# Patient Record
Sex: Female | Born: 1985 | Hispanic: Yes | Marital: Single | Smoking: Never smoker
Health system: Southern US, Community
[De-identification: ages and names within clinical notes are randomized; demographics above are authoritative.]

---

## 2007-05-16 ENCOUNTER — Emergency Department: Payer: Self-pay | Admitting: Emergency Medicine

## 2011-11-14 ENCOUNTER — Emergency Department: Payer: Self-pay | Admitting: Emergency Medicine

## 2012-01-12 ENCOUNTER — Emergency Department: Payer: Self-pay | Admitting: Emergency Medicine

## 2012-01-12 LAB — COMPREHENSIVE METABOLIC PANEL
Albumin: 3.7 g/dL (ref 3.4–5.0)
Alkaline Phosphatase: 75 U/L (ref 50–136)
BUN: 8 mg/dL (ref 7–18)
Chloride: 104 mmol/L (ref 98–107)
Co2: 25 mmol/L (ref 21–32)
Creatinine: 0.66 mg/dL (ref 0.60–1.30)
EGFR (African American): >60
Glucose: 89 mg/dL (ref 65–99)
SGOT(AST): 16 U/L (ref 15–37)
SGPT (ALT): 27 U/L
Sodium: 138 mmol/L (ref 136–145)
Total Protein: 7.3 g/dL (ref 6.4–8.2)

## 2012-01-12 LAB — URINALYSIS, COMPLETE
Bilirubin,UR: NEGATIVE
Glucose,UR: NEGATIVE mg/dL (ref 0–75)
Nitrite: NEGATIVE
Ph: 5 (ref 4.5–8.0)
Protein: NEGATIVE
RBC,UR: 1 /HPF (ref 0–5)
Specific Gravity: 1.023 (ref 1.003–1.030)
WBC UR: 2 /HPF (ref 0–5)

## 2012-01-12 LAB — CBC
HCT: 39.2 % (ref 35.0–47.0)
HGB: 13.3 g/dL (ref 12.0–16.0)
MCH: 30.3 pg (ref 26.0–34.0)
MCHC: 33.9 g/dL (ref 32.0–36.0)
MCV: 89 fL (ref 80–100)
RDW: 12.5 % (ref 11.5–14.5)
WBC: 14.9 10*3/uL — ABNORMAL HIGH (ref 3.6–11.0)

## 2012-01-12 LAB — WET PREP, GENITAL

## 2013-05-21 IMAGING — CT CT ABD-PELV W/ CM
1 of 2 series · 15 of 32 positions shown, 19 images · non-contrast
Comparison: none

REASON FOR EXAM: (1) rlq pain; (2) rlq pain
COMMENTS:

[Series 2: 3mm soft tissue · axial · 0.73mm/px · z∈[-588,-110]mm · 15 of 173 slices shown, 19 images]
[im 7/173  soft-tissue]
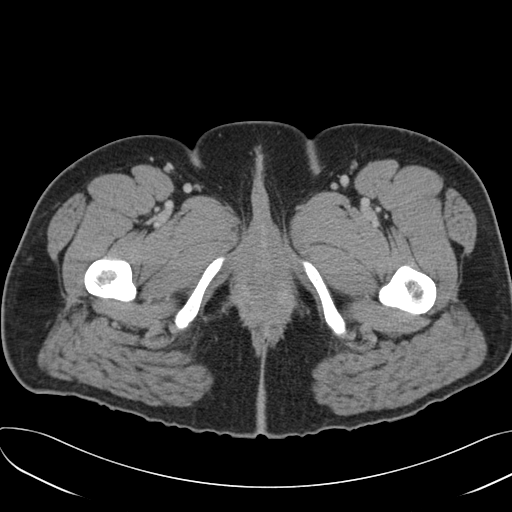
[im 7/173  bone]
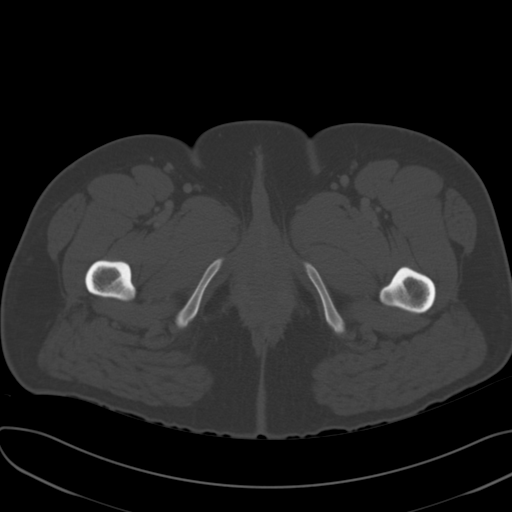
[im 21/173  soft-tissue]
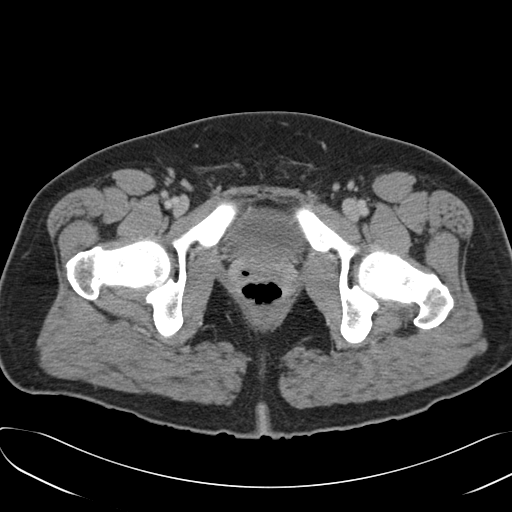
[im 35/173  soft-tissue]
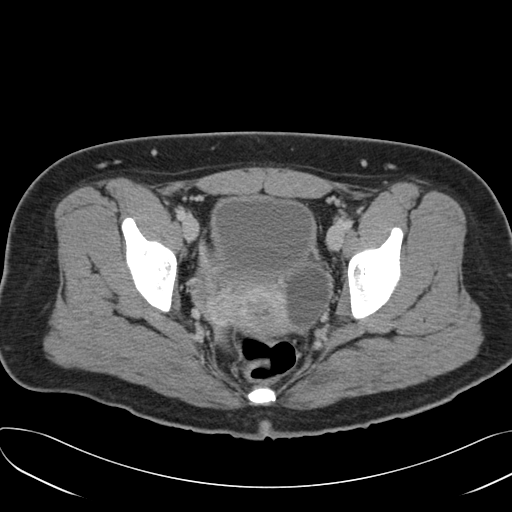
[im 49/173  soft-tissue]
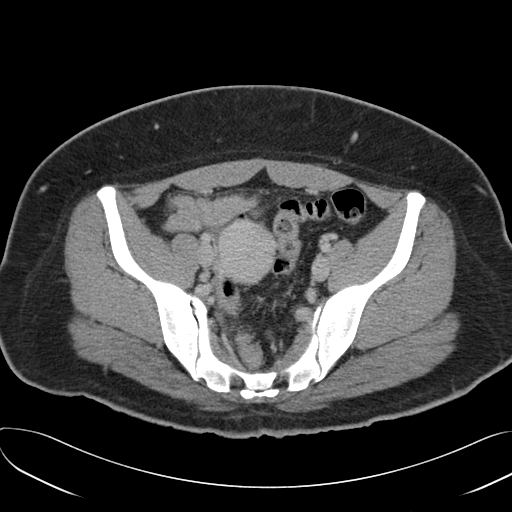
[im 62/173  soft-tissue]
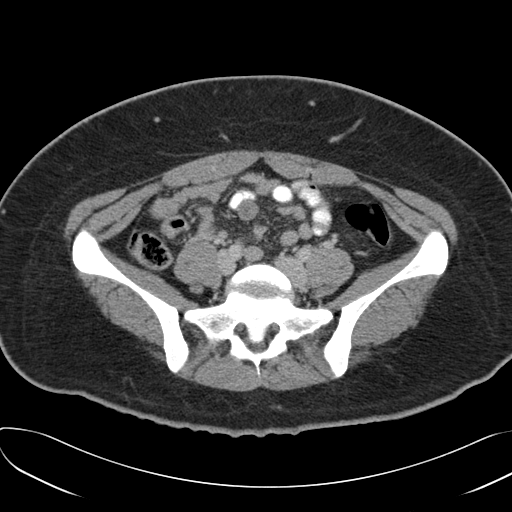
[im 76/173  soft-tissue]
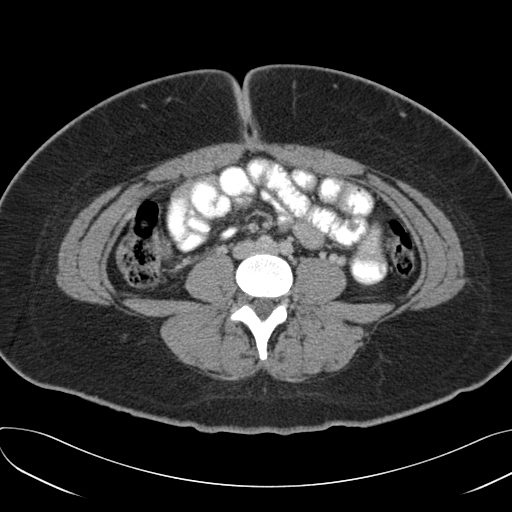
[im 90/173  soft-tissue]
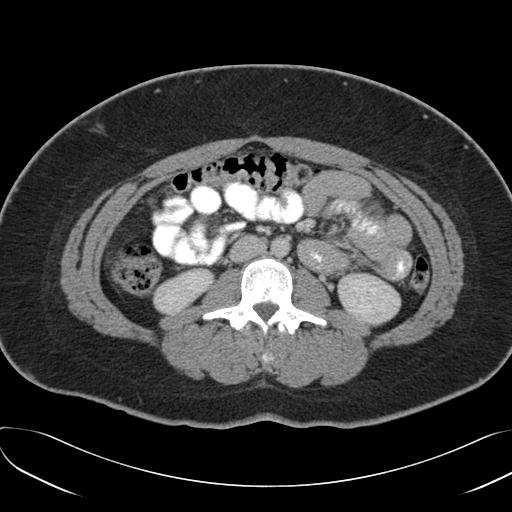
[im 97/173  soft-tissue]
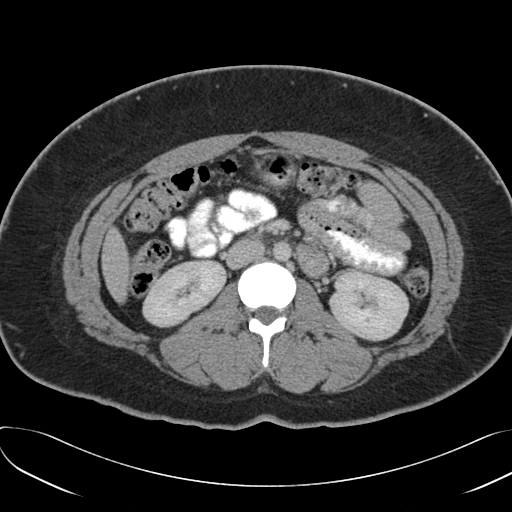
[im 111/173  soft-tissue]
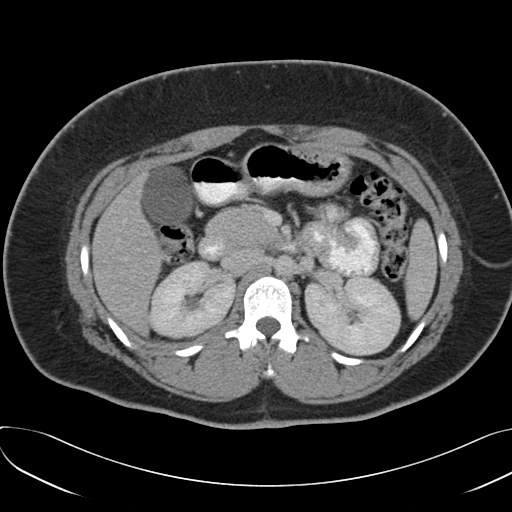
[im 111/173  bone]
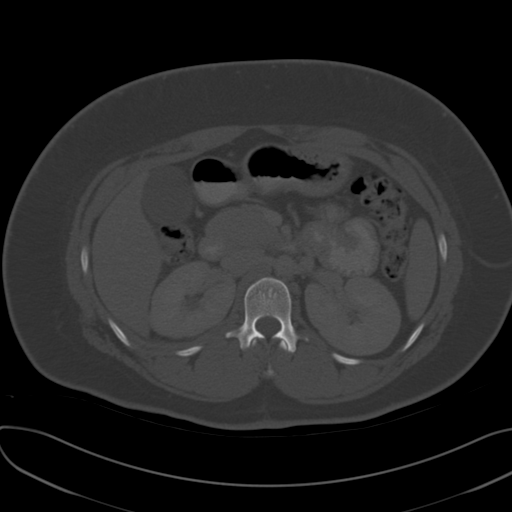
[im 124/173  soft-tissue]
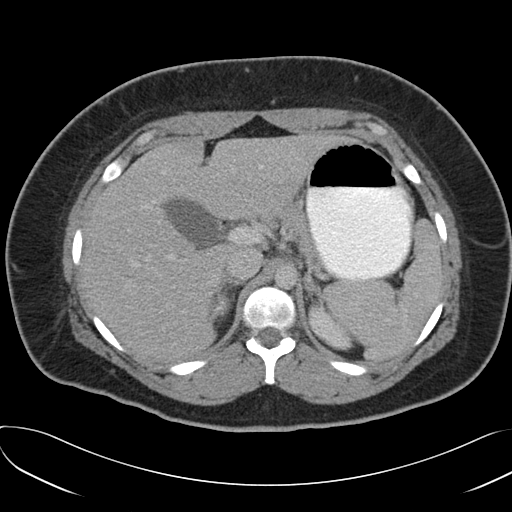
[im 138/173  soft-tissue]
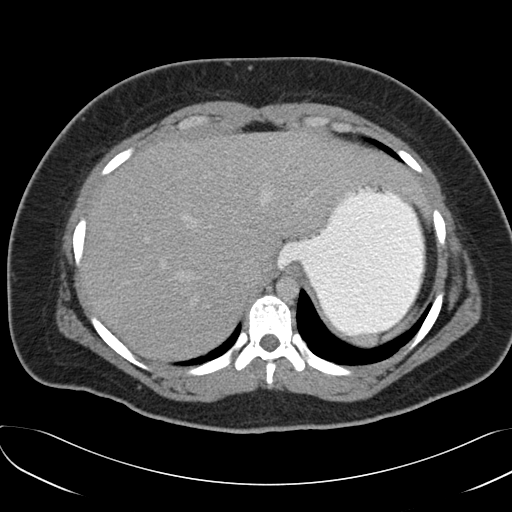
[im 145/173  lung]
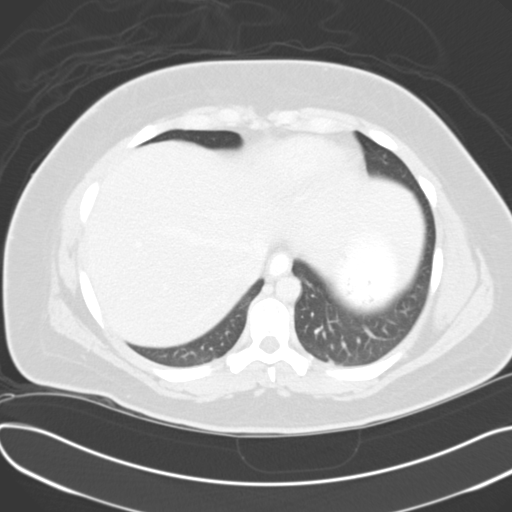
[im 152/173  soft-tissue]
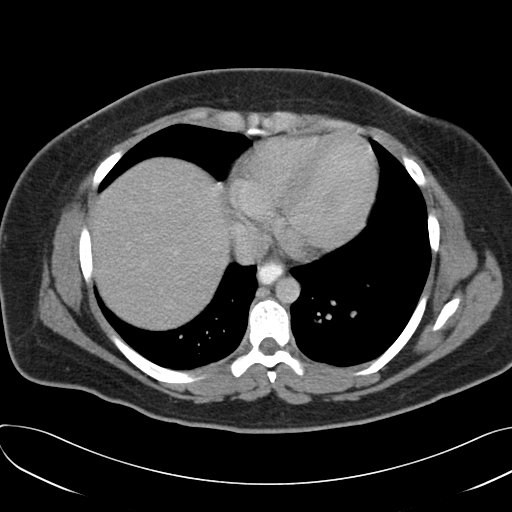
[im 152/173  lung]
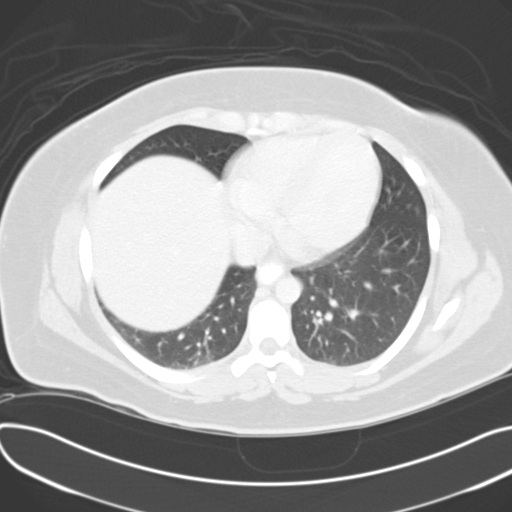
[im 159/173  lung]
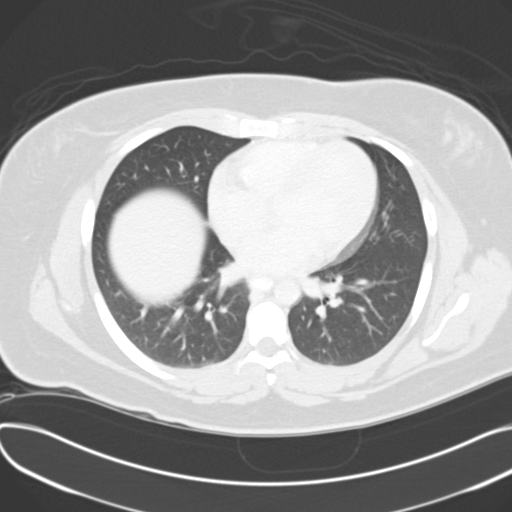
[im 166/173  soft-tissue]
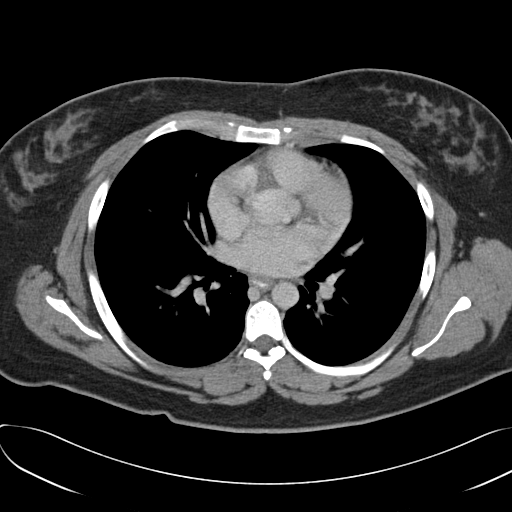
[im 166/173  lung]
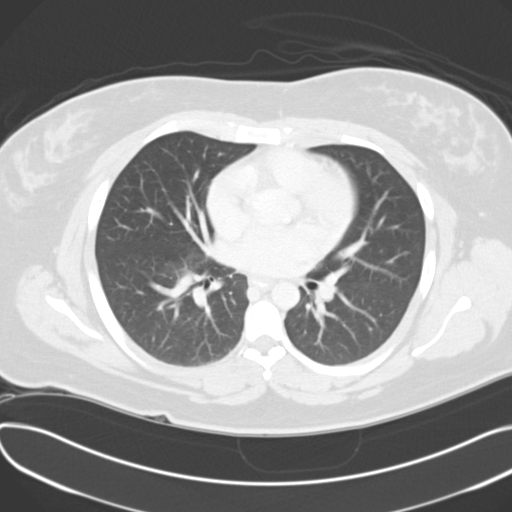

[15 of 32 positions shown; findings below may reference images not displayed]

PROCEDURE:     CT  - CT ABDOMEN / PELVIS  W  - January 12, 2012 [DATE]

RESULT:     CT of the abdomen and pelvis is performed utilizing a
noncontrast technique with images reconstructed at 3.0 mm slice thickness in
the axial plane compared to the previous contrast enhanced examination
performed on 14 November, 2011.

There is a cystic collection in the left adnexal region measuring
approximately 5.59 cm anterior to posterior and 3.96 cm medial to lateral on
image 136. This is suggestive of a large left ovarian cystic mass. This was
not present on the previous exam. Pelvic ultrasound followup in 6 to 8 weeks
would be recommended to evaluate resolution.

The urinary bladder wall appears to be slightly thickened. Correlate for
cystitis. No right adnexal mass is present. The appendix is normal. The
kidneys show no mass or obstruction. The liver, spleen, pancreas,
gallbladder, aorta and adrenal glands appear unremarkable. No
retroperitoneal mass or adenopathy is evident. Oral contrast has not passed
completely through the small bowel to the colon at the time of acquisition.
No definite obstructive change is observed. The abdominal wall appears
intact.
IMPRESSION: 1. Cystic mass in the left adnexal region which may represent a benign left
ovarian cyst. Followup pelvic ultrasound in 6 to 8 weeks is recommended to
document resolution or persistence.
2. No other significant abnormality. The lung bases are clear. The bony
structures are unremarkable. The appendix appears unremarkable. No bowel
obstruction or perforation evident.

## 2017-12-08 DIAGNOSIS — O3680X0 Pregnancy with inconclusive fetal viability, not applicable or unspecified: Secondary | ICD-10-CM | POA: Insufficient documentation

## 2017-12-09 DIAGNOSIS — O0330 Unspecified complication following incomplete spontaneous abortion: Secondary | ICD-10-CM | POA: Insufficient documentation

## 2018-12-13 ENCOUNTER — Other Ambulatory Visit: Payer: Self-pay

## 2018-12-13 ENCOUNTER — Encounter: Payer: Self-pay | Admitting: Emergency Medicine

## 2018-12-13 ENCOUNTER — Emergency Department
Admission: EM | Admit: 2018-12-13 | Discharge: 2018-12-13 | Disposition: A | Discharge status: Home / Self Care | Payer: Managed Care, Other (non HMO) | Attending: Emergency Medicine | Admitting: Emergency Medicine

## 2018-12-13 DIAGNOSIS — R05 Cough: Secondary | ICD-10-CM | POA: Diagnosis present

## 2018-12-13 DIAGNOSIS — J101 Influenza due to other identified influenza virus with other respiratory manifestations: Secondary | ICD-10-CM

## 2018-12-13 LAB — INFLUENZA PANEL BY PCR (TYPE A & B)
Influenza A By PCR: POSITIVE — AB
Influenza B By PCR: NEGATIVE

## 2018-12-13 MED ORDER — BENZONATATE 200 MG PO CAPS: 200.0000 mg | ORAL_CAPSULE | Freq: Three times a day (TID) | ORAL | 0 refills | Status: DC | PRN

## 2018-12-13 MED ORDER — ONDANSETRON 4 MG PO TBDP: 4.0000 mg | ORAL_TABLET | Freq: Three times a day (TID) | ORAL | 0 refills | Status: DC | PRN

## 2018-12-13 MED ORDER — ONDANSETRON 4 MG PO TBDP
4.0000 mg | ORAL_TABLET | Freq: Once | ORAL | Status: AC
  Administered 2018-12-13: 4 mg via ORAL
  Filled 2018-12-13: qty 1

## 2018-12-13 MED ORDER — ACETAMINOPHEN 325 MG PO TABS
650.0000 mg | ORAL_TABLET | Freq: Once | ORAL | Status: AC
  Administered 2018-12-13: 650 mg via ORAL
  Filled 2018-12-13: qty 2

## 2018-12-13 MED ORDER — OSELTAMIVIR PHOSPHATE 75 MG PO CAPS: 75.0000 mg | ORAL_CAPSULE | Freq: Two times a day (BID) | ORAL | 0 refills | Status: DC

## 2018-12-13 NOTE — Discharge Instructions (Signed)
Plenty of water.  Take Tylenol or ibuprofen as needed for pain and fever.  Tamiflu as desired.  This will only cover ear course by 1 day.  And will help alleviate symptoms.  Could also use over-the-counter TheraFlu.

## 2018-12-13 NOTE — ED Triage Notes (Addendum)
Patient ambulatory to triage with steady gait, without difficulty or distress noted, mask in place; pt reports today having fever, HA, cough & congestion, body aches

## 2018-12-13 NOTE — ED Provider Notes (Signed)
[facility] Emergency Department Provider Note  ____________________________________________   First MD Initiated Contact with Patient 12/13/18 2055     (approximate)  I have reviewed the triage vital signs and the nursing notes.   HISTORY  Chief Complaint Influenza    HPI Tammie Castillo is a 32 y.o. femalepresents emergency department complaining of cough and congestion.  Positive for body aches.  Symptoms for 1 days.  Positive for fever, chills, denies chest pain or shortness of breath.  States she just feels weak.  She vomited x5 today.  No vomiting since she has been in the ED.   History reviewed. No pertinent past medical history.  There are no active problems to display for this patient.   History reviewed. No pertinent surgical history.  Prior to Admission medications   Medication Sig Start Date End Date Taking? Authorizing Provider  benzonatate (TESSALON) 200 MG capsule Take 1 capsule (200 mg total) by mouth 3 (three) times daily as needed for cough. 12/13/18   Roni Scow, Roselyn Bering, PA-C  ondansetron (ZOFRAN-ODT) 4 MG disintegrating tablet Take 1 tablet (4 mg total) by mouth every 8 (eight) hours as needed for nausea or vomiting. 12/13/18   Sherrie Mustache Roselyn Bering, PA-C  oseltamivir (TAMIFLU) 75 MG capsule Take 1 capsule (75 mg total) by mouth 2 (two) times daily. 12/13/18   Faythe Ghee, PA-C    Allergies Patient has no known allergies.  No family history on file.  Social History Social History   Tobacco Use  . Smoking status: Never Smoker  . Smokeless tobacco: Never Used  Substance Use Topics  . Alcohol use: Not on file  . Drug use: Not on file    Review of Systems  Constitutional: No fever/chills Eyes: No visual changes. ENT: No sore throat. Respiratory: Positive cough and congestion, positive wheezing Genitourinary: Negative for dysuria. Musculoskeletal: Negative for back pain. Skin: Negative for  rash.    ____________________________________________   PHYSICAL EXAM:  VITAL SIGNS: ED Triage Vitals  Enc Vitals Group     BP 12/13/18 1954 (!) 160/93     Pulse Rate 12/13/18 1954 (!) 105     Resp 12/13/18 1954 18     Temp 12/13/18 1954 99.6 F (37.6 C)     Temp Source 12/13/18 1954 Oral     SpO2 12/13/18 1954 95 %     Weight 12/13/18 1952 237 lb (107.5 kg)     Height 12/13/18 1952 5\' 3"  (1.6 m)     Head Circumference --      Peak Flow --      Pain Score 12/13/18 1952 10     Pain Loc --      Pain Edu? --      Excl. in GC? --     Constitutional: Alert and oriented. Well appearing and in no acute distress. Eyes: Conjunctivae are normal.  Head: Atraumatic. ENT: TMS clear bilaterally Nose: No congestion/rhinnorhea. Mouth/Throat: Mucous membranes are moist.  Throat appears normal NECK: Is supple, no lymphadenopathy is noted  cardiovascular: Normal rate, regular rhythm.  Heart sounds are normal Respiratory: Normal respiratory effort.  No retractions, lungs clear to auscultation.  Cough is dry and hacking Abdomen: Soft nontender bowel sounds normal GU: deferred Musculoskeletal: FROM all extremities, warm and well perfused Neurologic:  Normal speech and language.  Skin:  Skin is warm, dry and intact. No rash noted. Psychiatric: Mood and affect are normal. Speech and behavior are normal.  ____________________________________________   LABS (all labs ordered  are listed, but only abnormal results are displayed)  Labs Reviewed  INFLUENZA PANEL BY PCR (TYPE A & B) - Abnormal; Notable for the following components:      Result Value   Influenza A By PCR POSITIVE (*)    All other components within normal limits   ____________________________________________   ____________________________________________  RADIOLOGY    ____________________________________________   PROCEDURES  Procedure(s) performed: Zofran ODT, ibuprofen, p.o.  challenge  Procedures    ____________________________________________   INITIAL IMPRESSION / ASSESSMENT AND PLAN / ED COURSE  Pertinent labs & imaging results that were available during my care of the patient were reviewed by me and considered in my medical decision making (see chart for details).   Patient is 32 year old female presents emergency department flulike symptoms.  Physical exam she is febrile.  Remainder the exam is unremarkable.  Flu test is positive for influenza A.  Patient was given ibuprofen for fever, Zofran for any nausea/vomiting, and a p.o. challenge which she tolerated.  She was given a prescription for Tamiflu, Tessalon Perles, and Zofran.  She is to follow-up with regular doctor if not better in 3 to 5 days.  Return if worsening.  She states she understands will comply.  She was given a work note stating she cannot return to work until she has been fever free for 24 hours.  She was discharged in stable condition in the care of her family member.     As part of my medical decision making, I reviewed the following data within the electronic MEDICAL RECORD NUMBER Nursing notes reviewed and incorporated, Labs reviewed flu test positive for influenza A, Old chart reviewed, Notes from prior ED visits and [place] Controlled Substance Database  ____________________________________________   FINAL CLINICAL IMPRESSION(S) / ED DIAGNOSES  Final diagnoses:  Influenza A      NEW MEDICATIONS STARTED DURING THIS VISIT:  Discharge Medication List as of 12/13/2018 10:18 PM    START taking these medications   Details  benzonatate (TESSALON) 200 MG capsule Take 1 capsule (200 mg total) by mouth 3 (three) times daily as needed for cough., Starting Tue 12/13/2018, Print    ondansetron (ZOFRAN-ODT) 4 MG disintegrating tablet Take 1 tablet (4 mg total) by mouth every 8 (eight) hours as needed for nausea or vomiting., Starting Tue 12/13/2018, Print    oseltamivir (TAMIFLU) 75 MG  capsule Take 1 capsule (75 mg total) by mouth 2 (two) times daily., Starting Tue 12/13/2018, Print         Note:  This document was prepared using Dragon voice recognition software and may include unintentional dictation errors.     Faythe GheeFisher, Tanaiya Kolarik W, PA-C 12/13/18 2352    Sharman CheekStafford, Phillip, MD 12/16/18 2203

## 2020-01-25 ENCOUNTER — Other Ambulatory Visit: Payer: Self-pay | Admitting: Gerontology

## 2020-01-25 DIAGNOSIS — F4321 Adjustment disorder with depressed mood: Secondary | ICD-10-CM | POA: Insufficient documentation

## 2020-01-25 DIAGNOSIS — N83202 Unspecified ovarian cyst, left side: Secondary | ICD-10-CM | POA: Insufficient documentation

## 2020-01-25 DIAGNOSIS — N83201 Unspecified ovarian cyst, right side: Secondary | ICD-10-CM | POA: Insufficient documentation

## 2020-01-25 DIAGNOSIS — I10 Essential (primary) hypertension: Secondary | ICD-10-CM | POA: Insufficient documentation

## 2020-02-01 ENCOUNTER — Ambulatory Visit: Payer: Managed Care, Other (non HMO)

## 2020-02-06 ENCOUNTER — Other Ambulatory Visit: Payer: Self-pay

## 2020-02-06 ENCOUNTER — Ambulatory Visit
Admission: RE | Admit: 2020-02-06 | Discharge: 2020-02-06 | Disposition: A | Discharge status: Home / Self Care | Payer: Managed Care, Other (non HMO) | Source: Ambulatory Visit | Attending: Gerontology | Admitting: Gerontology

## 2020-02-06 DIAGNOSIS — N83202 Unspecified ovarian cyst, left side: Secondary | ICD-10-CM | POA: Insufficient documentation

## 2020-02-06 DIAGNOSIS — N83201 Unspecified ovarian cyst, right side: Secondary | ICD-10-CM | POA: Diagnosis present

## 2020-02-06 DIAGNOSIS — E559 Vitamin D deficiency, unspecified: Secondary | ICD-10-CM | POA: Insufficient documentation

## 2020-02-06 DIAGNOSIS — E538 Deficiency of other specified B group vitamins: Secondary | ICD-10-CM | POA: Insufficient documentation

## 2020-02-26 DIAGNOSIS — E282 Polycystic ovarian syndrome: Secondary | ICD-10-CM | POA: Insufficient documentation

## 2020-04-13 ENCOUNTER — Ambulatory Visit: Payer: Managed Care, Other (non HMO) | Attending: Oncology

## 2020-04-13 DIAGNOSIS — Z23 Encounter for immunization: Secondary | ICD-10-CM

## 2020-04-13 NOTE — Progress Notes (Signed)
   Covid-19 Vaccination Clinic  Name:  Tammie Castillo    MRN: [id number] DOB: [birth year]  04/13/2020  [name] was observed post Covid-19 immunization for 15 minutes without incident. She was provided with Vaccine Information Sheet and instruction to access the V-Safe system.   [name] was instructed to call 911 with any severe reactions post vaccine: Marland Kitchen Difficulty breathing  . Swelling of face and throat  . A fast heartbeat  . A bad rash all over body  . Dizziness and weakness   Immunizations Administered    Name Date Dose VIS Date Route   Pfizer COVID-19 Vaccine 04/13/2020  8:18 AM 0.3 mL 12/08/2019 Intramuscular   Manufacturer: [employer], [employer]   Lot: [id number]   NDC: [address]-8

## 2020-05-08 ENCOUNTER — Ambulatory Visit: Payer: Managed Care, Other (non HMO) | Attending: Internal Medicine

## 2020-05-08 DIAGNOSIS — Z23 Encounter for immunization: Secondary | ICD-10-CM

## 2020-05-08 NOTE — Progress Notes (Signed)
   Covid-19 Vaccination Clinic  Name:  Tammie Castillo    MRN: [id number] DOB: [birth year]  05/08/2020  [name] was observed post Covid-19 immunization for 30 minutes based on pre-vaccination screening without incident. She was provided with Vaccine Information Sheet and instruction to access the V-Safe system.   [name] was instructed to call 911 with any severe reactions post vaccine: Marland Kitchen Difficulty breathing  . Swelling of face and throat  . A fast heartbeat  . A bad rash all over body  . Dizziness and weakness   Immunizations Administered    Name Date Dose VIS Date Route   Pfizer COVID-19 Vaccine 05/08/2020  8:50 AM 0.3 mL 02/21/2019 Intramuscular   Manufacturer: [employer], [employer]   Lot: [id number]   NDC: [address]-5

## 2020-05-11 ENCOUNTER — Ambulatory Visit: Payer: Managed Care, Other (non HMO)

## 2021-02-24 ENCOUNTER — Encounter: Payer: Self-pay | Admitting: Podiatry

## 2021-02-24 ENCOUNTER — Other Ambulatory Visit: Payer: Self-pay

## 2021-02-24 ENCOUNTER — Ambulatory Visit (INDEPENDENT_AMBULATORY_CARE_PROVIDER_SITE_OTHER): Payer: Managed Care, Other (non HMO) | Admitting: Podiatry

## 2021-02-24 DIAGNOSIS — L603 Nail dystrophy: Secondary | ICD-10-CM | POA: Diagnosis not present

## 2021-02-24 NOTE — Patient Instructions (Signed)

## 2021-02-24 NOTE — Progress Notes (Signed)
  Subjective:  Patient ID: Tammie Castillo, female    DOB: July 24, 1986,  MRN: [id number] HPI Chief Complaint  Patient presents with  . Nail Problem    Hallux left - toenail cracked and discolored, injury in December, little tender, tried OTC meds-no help  . New Patient (Initial Visit)    35 y.o. female presents with the above complaint.   ROS: Denies fever chills nausea vomiting muscle aches pains calf pain back pain chest pain shortness of breath.  No past medical history on file. No past surgical history on file.  Current Outpatient Medications:  .  ESTARYLLA 0.25-35 MG-MCG tablet, Take 1 tablet by mouth daily., Disp: , Rfl:  .  losartan (COZAAR) 25 MG tablet, Take 25 mg by mouth daily., Disp: , Rfl:   Allergies  Allergen Reactions  . Pineapple Rash   Review of Systems Objective:  There were no vitals filed for this visit.  General: Well developed, nourished, in no acute distress, alert and oriented x3   Dermatological: Skin is warm, dry and supple bilateral. Nails x 10 are well maintained; remaining integument appears unremarkable at this time. There are no open sores, no preulcerative lesions, no rash or signs of infection present.  Hallux nail left does demonstrate distal onycholysis with subungual debris and discoloration and tearing of the nail.  Also there is mild erythema and tenderness on palpation.  No purulence no malodor.  Vascular: Dorsalis Pedis artery and Posterior Tibial artery pedal pulses are 2/4 bilateral with immedate capillary fill time. Pedal hair growth present. No varicosities and no lower extremity edema present bilateral.   Neruologic: Grossly intact via light touch bilateral. Vibratory intact via tuning fork bilateral. Protective threshold with Semmes Wienstein monofilament intact to all pedal sites bilateral. Patellar and Achilles deep tendon reflexes 2+ bilateral. No Babinski or clonus noted bilateral.   Musculoskeletal: No gross boney pedal  deformities bilateral. No pain, crepitus, or limitation noted with foot and ankle range of motion bilateral. Muscular strength 5/5 in all groups tested bilateral.  Gait: Unassisted, Nonantalgic.    Radiographs:  Radiographs not taken  Assessment & Plan:   Assessment: Nail dystrophy hallux left painful nail hallux left  Plan: Discussed etiology pathology conservative surgical therapies at this point time after local anesthetic was administered we avulsed the entire nail hallux left she tolerated procedure well without complications this nail was sent for pathologic evaluation and I will follow-up with her once that comes back.  She was provided with oral and written home-going instruction for the care and soaking of the toe and I will follow-up with her for this in a couple of weeks.  She also received a prescription for Cortisporin Otic to be applied twice daily after soaking.     [employer], [place]

## 2021-03-04 ENCOUNTER — Encounter: Payer: Self-pay | Admitting: *Deleted

## 2021-03-26 ENCOUNTER — Other Ambulatory Visit: Payer: Self-pay

## 2021-03-26 ENCOUNTER — Ambulatory Visit (INDEPENDENT_AMBULATORY_CARE_PROVIDER_SITE_OTHER): Payer: Managed Care, Other (non HMO) | Admitting: Podiatry

## 2021-03-26 DIAGNOSIS — Z79899 Other long term (current) drug therapy: Secondary | ICD-10-CM | POA: Diagnosis not present

## 2021-03-26 MED ORDER — TERBINAFINE HCL 250 MG PO TABS: 250.0000 mg | ORAL_TABLET | Freq: Every day | ORAL | 0 refills | Status: DC

## 2021-03-26 NOTE — Progress Notes (Signed)
Tammie Castillo presents today for follow-up of her nail avulsion and pathology of her toenail.  States that she is doing just fine no problems whatsoever.  Objective: Vital signs are stable alert oriented x3 nail bed has grown over and healed completely.  There is still some dried blood beneath some of the superficial tissues but there is scaly and is most likely come off.  Everything looks very good there is no signs of infection whatsoever.  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  Assessment: Well-healing surgical nail avulsion hallux left.  Onychomycosis and nail dystrophy.  Plan: Discussed etiology pathology conservative versus surgical therapies.  At this point we discussed the pros and cons of topical therapy versus oral therapy.  She would like to go ahead and start the oral therapy stating that she feels that this runs in her family and she would like to get rid of it.  So were going to start oral therapy and allow the nail to grow out clear.  We did discuss the pros and cons of this medication and the risks associated with it she understands and is amenable to it were going to request a a.m. CMP and almost get her started on 30 days of Lamisil 1 tablet p.o. daily 250 mg tablets.  Follow-up with me in 1 month questions or concerns she will notify us immediately.

## 2021-03-26 NOTE — Patient Instructions (Signed)
Terbinafine tablets What is this medicine? TERBINAFINE (TER bin a feen) is an antifungal medicine. It is used to treat certain kinds of fungal or yeast infections. This medicine may be used for other purposes; ask your health care provider or pharmacist if you have questions. COMMON BRAND NAME(S): Lamisil, Terbinex What should I tell my health care provider before I take this medicine? They need to know if you have any of these conditions:  drink alcoholic beverages  kidney disease  liver disease  an unusual or allergic reaction to terbinafine, other medicines, foods, dyes, or preservatives  pregnant or trying to get pregnant  breast-feeding How should I use this medicine? Take this medicine by mouth with a full glass of water. Follow the directions on the prescription label. You can take this medicine with food or on an empty stomach. Take your medicine at regular intervals. Do not take your medicine more often than directed. Do not skip doses or stop your medicine early even if you feel better. Do not stop taking except on your doctor's advice. A special MedGuide will be given to you by the pharmacist with each prescription and refill. Be sure to read this information carefully each time. Talk to your pediatrician regarding the use of this medicine in children. Special care may be needed. Overdosage: If you think you have taken too much of this medicine contact a poison control center or emergency room at once. NOTE: This medicine is only for you. Do not share this medicine with others. What if I miss a dose? If you miss a dose, take it as soon as you can. If it is almost time for your next dose, take only that dose. Do not take double or extra doses. What may interact with this medicine? Do not take this medicine with any of the following medications:  thioridazine This medicine may also interact with the following  medications:  beta-blockers  caffeine  cimetidine  cyclosporine  medicines for depression, anxiety, or psychotic disturbances  medicines for fungal infections like fluconazole and ketoconazole  medicines for irregular heartbeat like amiodarone, flecainide and propafenone  rifampin  warfarin This list may not describe all possible interactions. Give your health care provider a list of all the medicines, herbs, non-prescription drugs, or dietary supplements you use. Also tell them if you smoke, drink alcohol, or use illegal drugs. Some items may interact with your medicine. What should I watch for while using this medicine? Visit your doctor or health care provider regularly. Tell your doctor right away if you have nausea or vomiting, loss of appetite, stomach pain on your right upper side, yellow skin, dark urine, light stools, or are over tired. Some fungal infections need many weeks or months of treatment to cure. If you are taking this medicine for a long time, you will need to have important blood work done. This medicine may cause serious skin reactions. They can happen weeks to months after starting the medicine. Contact your health care provider right away if you notice fevers or flu-like symptoms with a rash. The rash may be red or purple and then turn into blisters or peeling of the skin. Or, you might notice a red rash with swelling of the face, lips or lymph nodes in your neck or under your arms. What side effects may I notice from receiving this medicine? Side effects that you should report to your doctor or health care professional as soon as possible:  allergic reactions like skin rash or hives,   swelling of the face, lips, or tongue  changes in vision  dark urine  fever or infection  general ill feeling or flu-like symptoms  light-colored stools  loss of appetite, nausea  rash, fever, and swollen lymph nodes  redness, blistering, peeling or loosening of the  skin, including inside the mouth  right upper belly pain  unusually weak or tired  yellowing of the eyes or skin Side effects that usually do not require medical attention (report to your doctor or health care professional if they continue or are bothersome):  changes in taste  diarrhea  hair loss  muscle or joint pain  stomach gas  stomach upset This list may not describe all possible side effects. Call your doctor for medical advice about side effects. You may report side effects to FDA at 1-800-FDA-1088. Where should I keep my medicine? Keep out of the reach of children. Store at room temperature below 25 degrees C (77 degrees F). Protect from light. Throw away any unused medicine after the expiration date. NOTE: This sheet is a summary. It may not cover all possible information. If you have questions about this medicine, talk to your doctor, pharmacist, or health care provider.  2021 Elsevier/Gold Standard (2019-03-24 15:37:07)  

## 2021-03-27 LAB — COMPREHENSIVE METABOLIC PANEL
ALT: 33 IU/L — ABNORMAL HIGH (ref 0–32)
AST: 26 IU/L (ref 0–40)
Albumin/Globulin Ratio: 1.8 (ref 1.2–2.2)
Albumin: 4.6 g/dL (ref 3.8–4.8)
Alkaline Phosphatase: 92 IU/L (ref 44–121)
BUN/Creatinine Ratio: 7 — ABNORMAL LOW (ref 9–23)
BUN: 7 mg/dL (ref 6–20)
Bilirubin Total: 0.7 mg/dL (ref 0.0–1.2)
CO2: 22 mmol/L (ref 20–29)
Calcium: 9.7 mg/dL (ref 8.7–10.2)
Chloride: 101 mmol/L (ref 96–106)
Creatinine, Ser: 0.99 mg/dL (ref 0.57–1.00)
Globulin, Total: 2.5 g/dL (ref 1.5–4.5)
Glucose: 88 mg/dL (ref 65–99)
Potassium: 3.8 mmol/L (ref 3.5–5.2)
Sodium: 138 mmol/L (ref 134–144)
Total Protein: 7.1 g/dL (ref 6.0–8.5)
eGFR: 78 mL/min/{1.73_m2} (ref >59)

## 2021-03-28 ENCOUNTER — Telehealth: Payer: Self-pay

## 2021-03-28 ENCOUNTER — Other Ambulatory Visit: Payer: Self-pay | Admitting: Gerontology

## 2021-03-28 DIAGNOSIS — Z1231 Encounter for screening mammogram for malignant neoplasm of breast: Secondary | ICD-10-CM

## 2021-03-28 NOTE — Telephone Encounter (Signed)
-----   Message from Elinor Parkinson, [place] sent at 03/27/2021  5:19 PM EDT ----- Blood work looks good and may continue medication.

## 2021-03-28 NOTE — Telephone Encounter (Signed)
Patient notified of results via voice mail °

## 2021-04-30 ENCOUNTER — Ambulatory Visit (INDEPENDENT_AMBULATORY_CARE_PROVIDER_SITE_OTHER): Payer: Managed Care, Other (non HMO) | Admitting: Podiatry

## 2021-04-30 ENCOUNTER — Other Ambulatory Visit: Payer: Self-pay

## 2021-04-30 DIAGNOSIS — Z79899 Other long term (current) drug therapy: Secondary | ICD-10-CM | POA: Diagnosis not present

## 2021-04-30 MED ORDER — TERBINAFINE HCL 250 MG PO TABS: 250.0000 mg | ORAL_TABLET | Freq: Every day | ORAL | 0 refills | Status: DC

## 2021-04-30 NOTE — Progress Notes (Signed)
She presents today for follow-up of her Lamisil therapy for her onychomycosis and tinea pedis.  She denies any fever chills nausea vomiting muscle aches pains calf pain back pain chest pain shortness of breath itching or rashes.  States that she is able to easily take the medication at nighttime and has absolutely no side effects whatsoever.  Objective: No changes in physical exam.  Assessment: Long-term therapy with Lamisil for onychomycosis and tinea pedis.  Plan: Requested complete metabolic panel today provided her with a requisition she will go to [facility] to have that drawn and we then also prescribed another 90 days of Lamisil 1 tablet p.o. daily for 90 days and I will follow-up with her in 4 months.  Should her blood work come back abnormal we will notify her immediately.

## 2021-05-01 LAB — COMPREHENSIVE METABOLIC PANEL
ALT: 31 IU/L (ref 0–32)
AST: 26 IU/L (ref 0–40)
Albumin/Globulin Ratio: 2.1 (ref 1.2–2.2)
Albumin: 4.6 g/dL (ref 3.8–4.8)
Alkaline Phosphatase: 101 IU/L (ref 44–121)
BUN/Creatinine Ratio: 13 (ref 9–23)
BUN: 10 mg/dL (ref 6–20)
Bilirubin Total: 0.4 mg/dL (ref 0.0–1.2)
CO2: 22 mmol/L (ref 20–29)
Calcium: 9.3 mg/dL (ref 8.7–10.2)
Chloride: 100 mmol/L (ref 96–106)
Creatinine, Ser: 0.76 mg/dL (ref 0.57–1.00)
Globulin, Total: 2.2 g/dL (ref 1.5–4.5)
Glucose: 108 mg/dL — ABNORMAL HIGH (ref 65–99)
Potassium: 3.6 mmol/L (ref 3.5–5.2)
Sodium: 138 mmol/L (ref 134–144)
Total Protein: 6.8 g/dL (ref 6.0–8.5)
eGFR: 107 mL/min/{1.73_m2} (ref >59)

## 2021-05-02 ENCOUNTER — Telehealth: Payer: Self-pay

## 2021-05-02 NOTE — Telephone Encounter (Signed)
-----   Message from Elinor Parkinson, [place] sent at 05/01/2021  4:46 PM EDT ----- Blood work looks better so continue medication.

## 2021-05-02 NOTE — Telephone Encounter (Signed)
Patient has been notified of results.  

## 2021-06-15 IMAGING — US US PELVIS COMPLETE WITH TRANSVAGINAL
1 series · 14 of 25 positions shown · non-contrast
Comparison: None

CLINICAL DATA: Ovarian cyst, question polycystic ovarian syndrome

EXAM:
TRANSABDOMINAL AND TRANSVAGINAL ULTRASOUND OF PELVIS
TECHNIQUE: Both transabdominal and transvaginal ultrasound examinations of the
pelvis were performed. Transabdominal technique was performed for
global imaging of the pelvis including uterus, ovaries, adnexal
regions, and pelvic cul-de-sac. It was necessary to proceed with
endovaginal exam following the transabdominal exam to visualize the
endometrium, ovaries.

[Series 1: us pelvis complete with transvaginal · 0.21mm/px · 93 acquisitions, 14 frames shown]
[im 1/93]
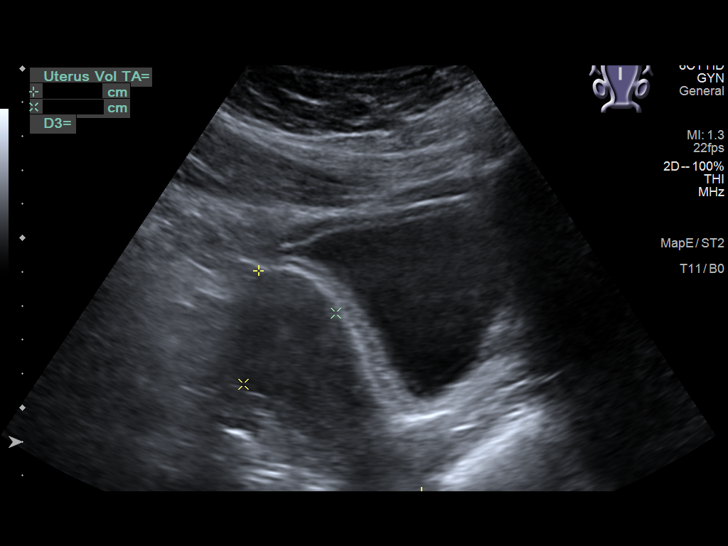
[im 8/93]
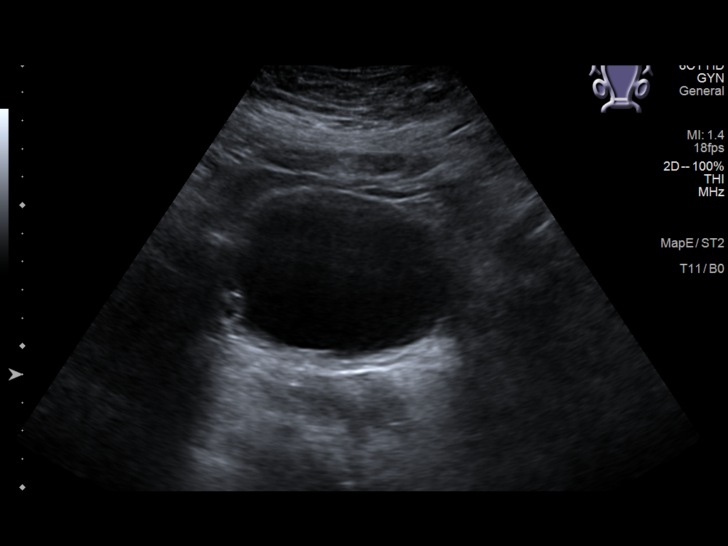
[im 16/93]
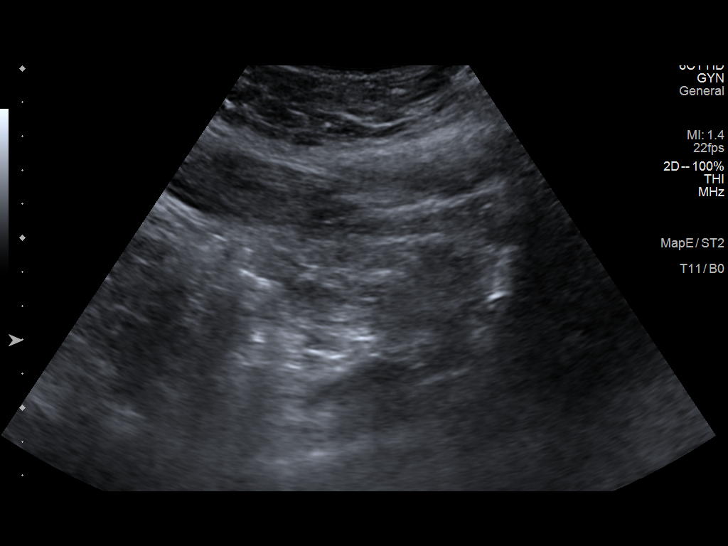
[im 24/93]
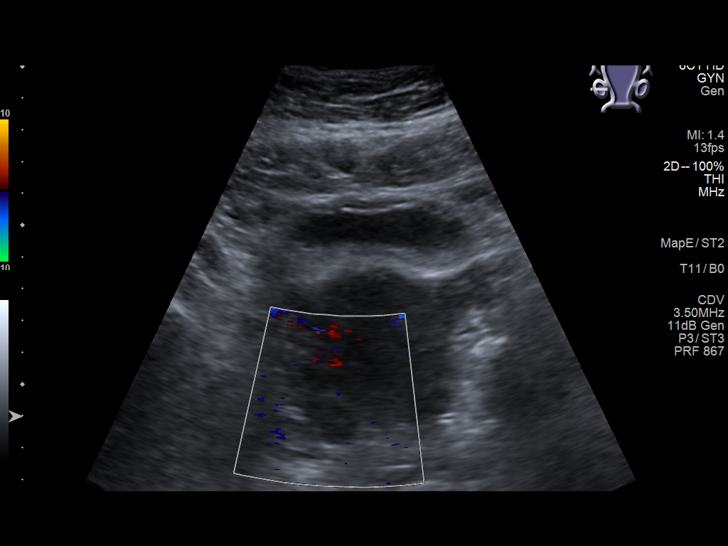
[im 31/93]
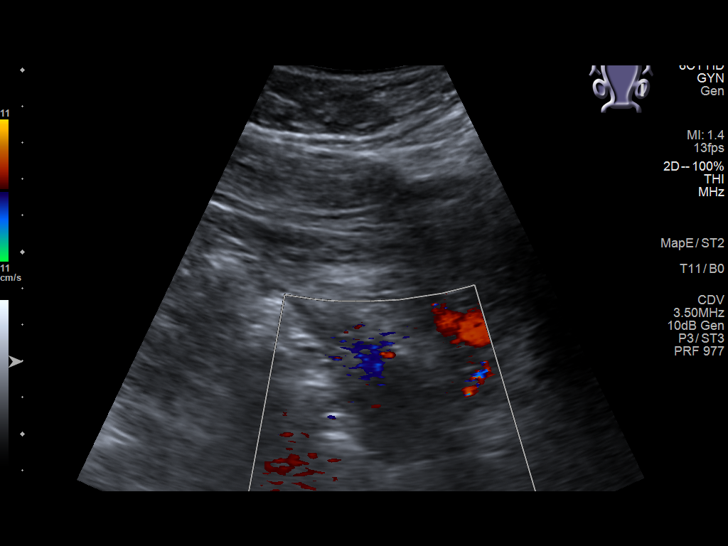
[im 35/93]
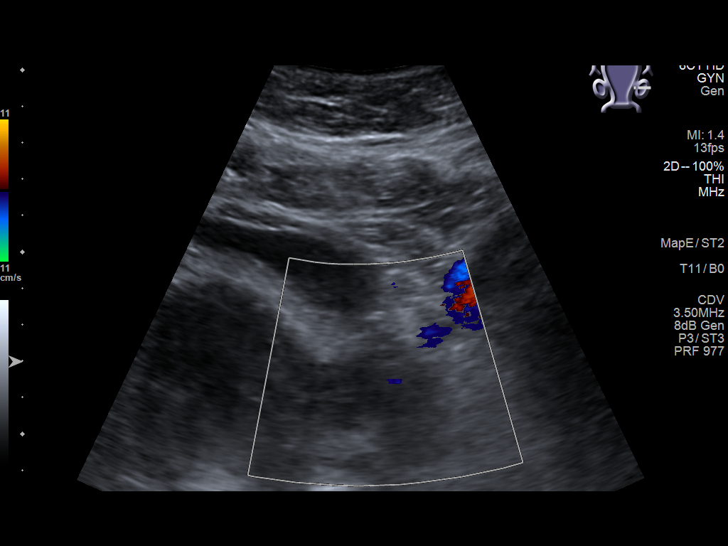
[im 43/93]
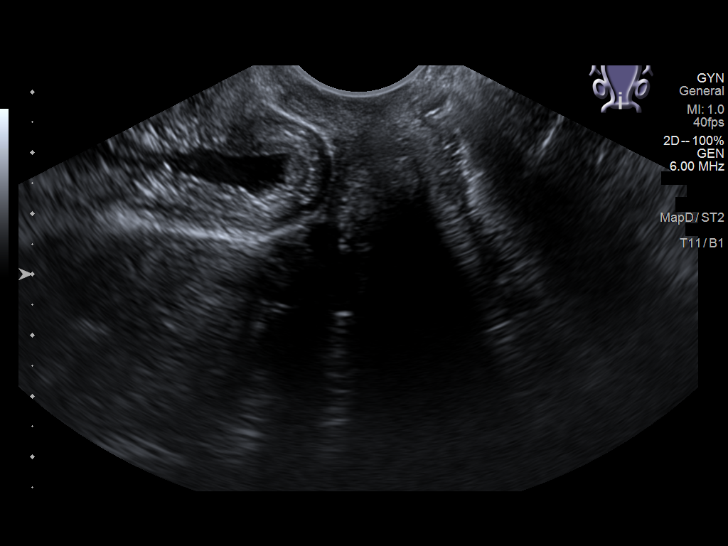
[im 50/93]
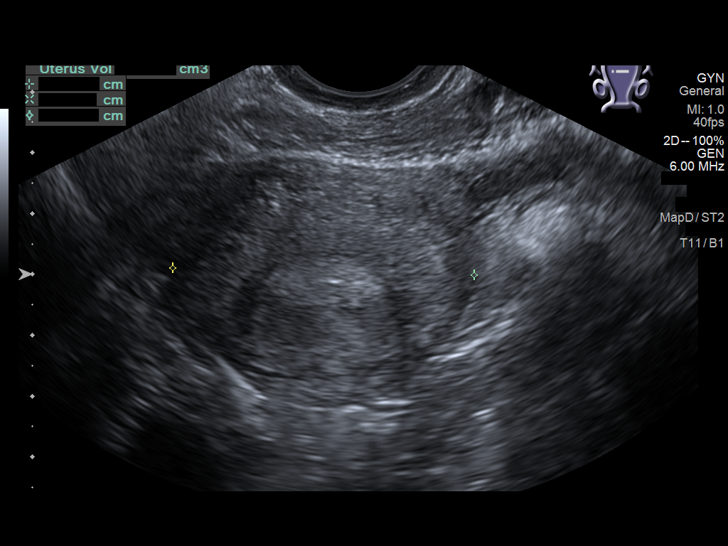
[im 58/93]
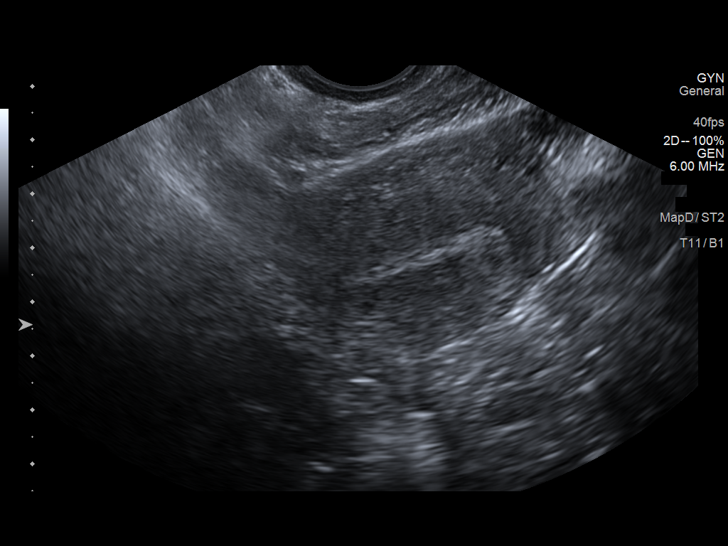
[im 62/93]
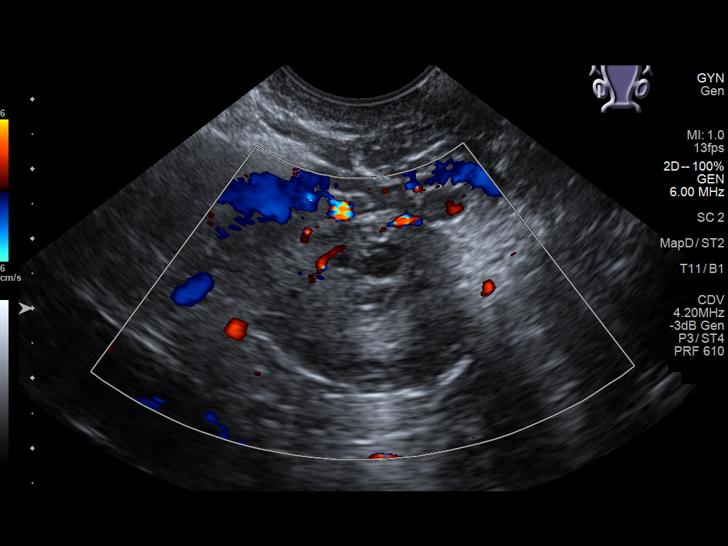
[im 70/93]
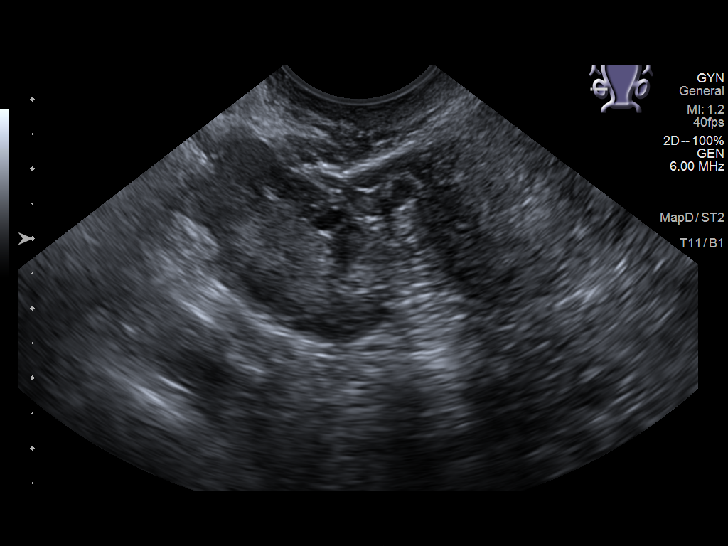
[im 77/93]
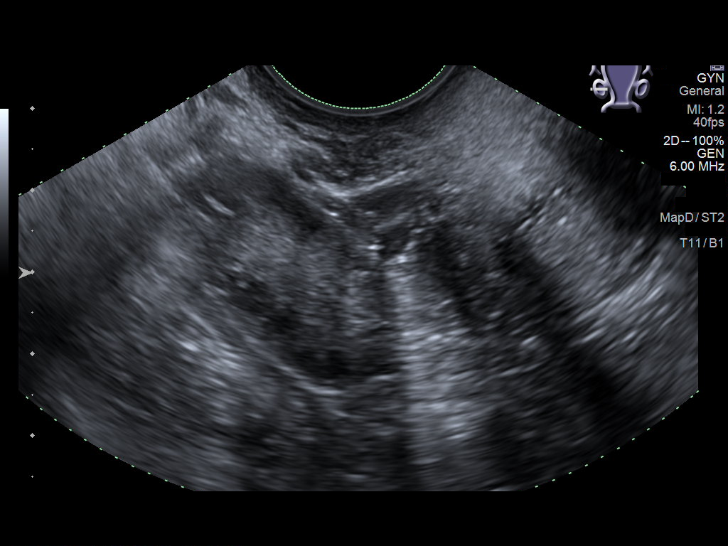
[im 85/93]
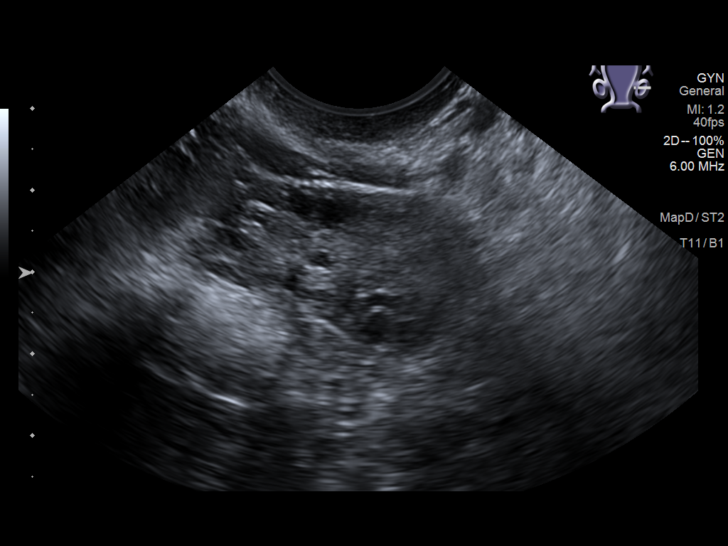
[im 93/93]
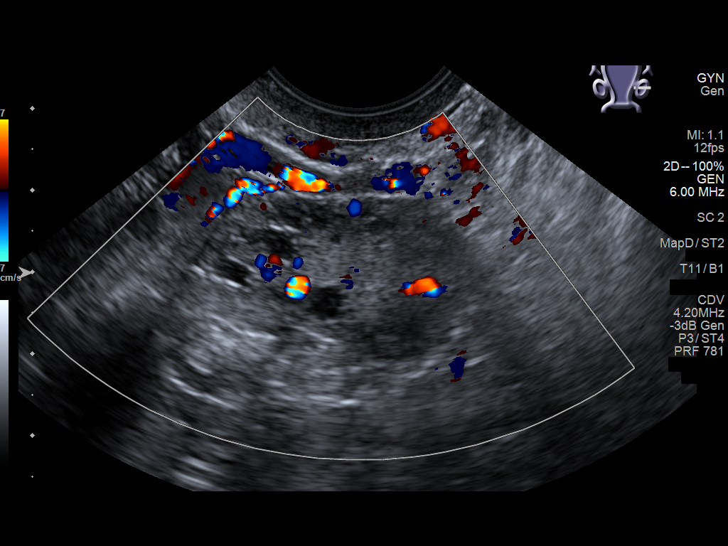

[14 of 25 positions shown; findings below may reference images not displayed]

FINDINGS: Uterus

Measurements: 8.6 x 3.9 x 5.0 cm = volume: 87 mL. Anteverted. Normal
morphology without mass. Incidentally noted nabothian cysts at
cervix.

Endometrium

Thickness: 9 mm.  No endometrial fluid or focal abnormality.

Right ovary

Measurements: 2.7 x 2.4 x 3.2 cm = volume: 10.8 mL. Normal
morphology without mass

Left ovary

Measurements: 3.4 x 1.8 x 3.0 cm = volume: 9.6 ML. Normal morphology
without mass

Other findings

No free pelvic fluid.  No adnexal masses.
IMPRESSION: Nabothian cysts in cervix.

Otherwise negative exam.

## 2021-09-03 ENCOUNTER — Other Ambulatory Visit: Payer: Self-pay

## 2021-09-03 ENCOUNTER — Ambulatory Visit (INDEPENDENT_AMBULATORY_CARE_PROVIDER_SITE_OTHER): Payer: Managed Care, Other (non HMO) | Admitting: Podiatry

## 2021-09-03 ENCOUNTER — Encounter: Payer: Self-pay | Admitting: Podiatry

## 2021-09-03 DIAGNOSIS — L603 Nail dystrophy: Secondary | ICD-10-CM | POA: Diagnosis not present

## 2021-09-03 MED ORDER — TERBINAFINE HCL 250 MG PO TABS: 250.0000 mg | ORAL_TABLET | Freq: Every day | ORAL | 0 refills | Status: AC

## 2021-09-03 NOTE — Progress Notes (Signed)
She presents today for follow-up of her nail fungus she is completed 60 days of Lamisil and is very happy with her nails because they are almost 95% grown out hallux right.  Objective: Vital signs stable oriented x3 hallux nails are nearly completely grown out.  Assessment: Well-healing onychomycosis long-term therapy with Lamisil.  Plan: At this point unguinal drop her concentration to half a banana suggest that she take 1 tablet every other day

## 2021-12-03 ENCOUNTER — Encounter: Payer: Managed Care, Other (non HMO) | Admitting: Podiatry

## 2022-04-01 ENCOUNTER — Other Ambulatory Visit: Payer: Self-pay | Admitting: Gerontology

## 2022-04-01 DIAGNOSIS — Z803 Family history of malignant neoplasm of breast: Secondary | ICD-10-CM

## 2023-03-10 ENCOUNTER — Other Ambulatory Visit: Payer: Self-pay | Admitting: Gerontology

## 2023-03-10 ENCOUNTER — Other Ambulatory Visit: Payer: Self-pay

## 2023-03-10 DIAGNOSIS — Z1231 Encounter for screening mammogram for malignant neoplasm of breast: Secondary | ICD-10-CM

## 2023-03-10 DIAGNOSIS — Z803 Family history of malignant neoplasm of breast: Secondary | ICD-10-CM

## 2023-03-19 ENCOUNTER — Ambulatory Visit
Admission: RE | Admit: 2023-03-19 | Discharge: 2023-03-19 | Disposition: A | Discharge status: Home / Self Care | Payer: Managed Care, Other (non HMO) | Source: Ambulatory Visit | Attending: Gerontology | Admitting: Gerontology

## 2023-03-19 DIAGNOSIS — Z803 Family history of malignant neoplasm of breast: Secondary | ICD-10-CM | POA: Diagnosis not present

## 2023-03-19 DIAGNOSIS — Z1231 Encounter for screening mammogram for malignant neoplasm of breast: Secondary | ICD-10-CM | POA: Diagnosis not present
